# Patient Record
Sex: Male | Born: 1995 | Race: Black or African American | Hispanic: No | Marital: Single | Smoking: Never smoker
Health system: Southern US, Community
[De-identification: ages and names within clinical notes are randomized; demographics above are authoritative.]

## PROBLEM LIST (undated history)

## (undated) HISTORY — PX: KNEE SURGERY: SHX244

---
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
## 2013-09-17 ENCOUNTER — Emergency Department: Payer: Self-pay | Admitting: Emergency Medicine

## 2013-09-19 LAB — BETA STREP CULTURE(ARMC)

## 2014-02-24 ENCOUNTER — Emergency Department: Payer: Self-pay | Admitting: Emergency Medicine

## 2015-03-14 ENCOUNTER — Emergency Department: Payer: Self-pay

## 2015-03-14 ENCOUNTER — Encounter: Payer: Self-pay | Admitting: Emergency Medicine

## 2015-03-14 DIAGNOSIS — S8992XA Unspecified injury of left lower leg, initial encounter: Secondary | ICD-10-CM | POA: Insufficient documentation

## 2015-03-14 DIAGNOSIS — Y998 Other external cause status: Secondary | ICD-10-CM | POA: Insufficient documentation

## 2015-03-14 DIAGNOSIS — Y92310 Basketball court as the place of occurrence of the external cause: Secondary | ICD-10-CM | POA: Insufficient documentation

## 2015-03-14 DIAGNOSIS — Y9367 Activity, basketball: Secondary | ICD-10-CM | POA: Insufficient documentation

## 2015-03-14 DIAGNOSIS — X58XXXA Exposure to other specified factors, initial encounter: Secondary | ICD-10-CM | POA: Insufficient documentation

## 2015-03-14 NOTE — ED Notes (Signed)
Pt presents to ED with left knee pain. Pt states onset of pain occurred while playing basket ball. Pt reports he is unsure of injury but thinks maybe he "came down on it wrong". Slight swelling; no obvious deformity. Pain increases when walking and when lying down. similar symptoms previously and was dx with bone bruise at that time.

## 2015-03-15 ENCOUNTER — Emergency Department
Admission: EM | Admit: 2015-03-15 | Discharge: 2015-03-15 | Disposition: A | Discharge status: Home / Self Care | Payer: Self-pay | Attending: Emergency Medicine | Admitting: Emergency Medicine

## 2015-03-15 DIAGNOSIS — M25562 Pain in left knee: Secondary | ICD-10-CM

## 2015-03-15 MED ORDER — TRAMADOL HCL 50 MG PO TABS
100.0000 mg | ORAL_TABLET | Freq: Once | ORAL | Status: AC
  Administered 2015-03-15: 100 mg via ORAL
  Filled 2015-03-15: qty 2

## 2015-03-15 MED ORDER — TRAMADOL HCL 50 MG PO TABS: 100.0000 mg | ORAL_TABLET | Freq: Four times a day (QID) | ORAL | Status: DC | PRN

## 2015-03-15 NOTE — ED Provider Notes (Signed)
[facility] Emergency Department Provider Note  Time seen: 12:36 AM  I have reviewed the triage vital signs and the nursing notes.   HISTORY  Chief Complaint Knee Pain    HPI Seth Frank is a 19 y.o. male with no past medical history who presents the emergency department with left knee pain. According to the patient one week ago he was playing basketball when he states he jumped and landed on the knee with immediate pain to the left knee, with swelling. States since that time his knee has continued to bother him although states the swelling has come down, and is not as painful as it was. He is able to walk on it without limping but states is still uncomfortable. Denies any clicking or buckling of the knee.     History reviewed. No pertinent past medical history.  There are no active problems to display for this patient.   History reviewed. No pertinent past surgical history.  No current outpatient prescriptions on file.  Allergies Review of patient's allergies indicates no known allergies.  No family history on file.  Social History Social History  Substance Use Topics  . Smoking status: Never Smoker   . Smokeless tobacco: Never Used  . Alcohol Use: No    Review of Systems Constitutional: Negative for fever. Cardiovascular: Negative for chest pain. Respiratory: Negative for shortness of breath. Gastrointestinal: Negative for abdominal pain Musculoskeletal: Positive left knee pain Neurological: Negative for headache 10-point ROS otherwise negative.  ____________________________________________   PHYSICAL EXAM:  VITAL SIGNS: ED Triage Vitals  Enc Vitals Group     BP 03/14/15 2034 136/82 mmHg     Pulse Rate 03/14/15 2034 77     Resp 03/14/15 2034 18     Temp 03/14/15 2034 98.1 F (36.7 C)     Temp Source 03/14/15 2034 Oral     SpO2 03/14/15 2034 99 %     Weight 03/14/15 2034 185 lb (83.915 kg)     Height 03/14/15 2034   (1.702 m)     Head Cir --      Peak Flow --      Pain Score 03/14/15 2034 9     Pain Loc --      Pain Edu? --      Excl. in GC? --     Constitutional: Alert and oriented. Well appearing and in no distress. Eyes: Normal exam ENT   Head: Normocephalic and atraumatic   Mouth/Throat: Mucous membranes are moist. Cardiovascular: Normal rate, regular rhythm. No murmur Respiratory: Normal respiratory effort without tachypnea nor retractions. Breath sounds are clear Gastrointestinal: Soft and nontender. No distention.  Musculoskeletal: No tenderness to palpation of the left knee, possible small joint effusion, anterior drawer test negative, no clicking elicited on exam, no laxity noted on exam, neurovascularly intact Neurologic:  Normal speech and language. No gross focal neurologic deficits., Skin:  Skin is warm, dry and intact.  Psychiatric: Mood and affect are normal.   ____________________________________________      RADIOLOGY  X-ray negative for fracture, positive for effusion  INITIAL IMPRESSION / ASSESSMENT AND PLAN / ED COURSE  Pertinent labs & imaging results that were available during my care of the patient were reviewed by me and considered in my medical decision making (see chart for details).  X-ray shows a small effusion, no fracture. No laxity on knee exam. Discussed with the patient trial of pain medication, and follow-up with orthopedics for an MRI if symptoms do  not improve over the next 3-5 days. Patient agreeable.  ____________________________________________   FINAL CLINICAL IMPRESSION(S) / ED DIAGNOSES  Left knee pain   Minna AntisKevin Kristol Almanzar, MD 03/15/15 0040

## 2015-03-15 NOTE — Discharge Instructions (Signed)
As we have discussed your x-ray does not show any acute abnormality. Your pain could likely be due to to a meniscus/cartilage tear in the knee, ligament, tendon or muscle tear. Please take your pain medication as needed, as prescribed. Please follow-up with orthopedics by calling the number provided if her pain continues to arrange further evaluation.   Joint Pain Joint pain, which is also called arthralgia, can be caused by many things. Joint pain often goes away when you follow your health care provider's instructions for relieving pain at home. However, joint pain can also be caused by conditions that require further treatment. Common causes of joint pain include:  Bruising in the area of the joint.  Overuse of the joint.  Wear and tear on the joints that occur with aging (osteoarthritis).  Various other forms of arthritis.  A buildup of a crystal form of uric acid in the joint (gout).  Infections of the joint (septic arthritis) or of the bone (osteomyelitis). Your health care provider may recommend medicine to help with the pain. If your joint pain continues, additional tests may be needed to diagnose your condition. HOME CARE INSTRUCTIONS Watch your condition for any changes. Follow these instructions as directed to lessen the pain that you are feeling.  Take medicines only as directed by your health care provider.  Rest the affected area for as long as your health care provider says that you should. If directed to do so, raise the painful joint above the level of your heart while you are sitting or lying down.  Do not do things that cause or worsen pain.  If directed, apply ice to the painful area:  Put ice in a plastic bag.  Place a towel between your skin and the bag.  Leave the ice on for 20 minutes, 2-3 times per day.  Wear an elastic bandage, splint, or sling as directed by your health care provider. Loosen the elastic bandage or splint if your fingers or toes become  numb and tingle, or if they turn cold and blue.  Begin exercising or stretching the affected area as directed by your health care provider. Ask your health care provider what types of exercise are safe for you.  Keep all follow-up visits as directed by your health care provider. This is important. SEEK MEDICAL CARE IF:  Your pain increases, and medicine does not help.  Your joint pain does not improve within 3 days.  You have increased bruising or swelling.  You have a fever.  You lose 10 lb (4.5 kg) or more without trying. SEEK IMMEDIATE MEDICAL CARE IF:  You are not able to move the joint.  Your fingers or toes become numb or they turn cold and blue.   This information is not intended to replace advice given to you by your health care provider. Make sure you discuss any questions you have with your health care provider.   Document Released: 03/06/2005 Document Revised: 03/27/2014 Document Reviewed: 12/16/2013 Elsevier Interactive Patient Education 2016 Else[employer] Inc.  Knee Pain Knee pain is a very common symptom and can have many causes. Knee pain often goes away when you follow your health care provider's instructions for relieving pain and discomfort at home. However, knee pain can develop into a condition that needs treatment. Some conditions may include:  Arthritis caused by wear and tear (osteoarthritis).  Arthritis caused by swelling and irritation (rheumatoid arthritis or gout).  A cyst or growth in your knee.  An infection in your knee  joint.  An injury that will not heal.  Damage, swelling, or irritation of the tissues that support your knee (torn ligaments or tendinitis). If your knee pain continues, additional tests may be ordered to diagnose your condition. Tests may include X-rays or other imaging studies of your knee. You may also need to have fluid removed from your knee. Treatment for ongoing knee pain depends on the cause, but treatment may  include:  Medicines to relieve pain or swelling.  Steroid injections in your knee.  Physical therapy.  Surgery. HOME CARE INSTRUCTIONS  Take medicines only as directed by your health care provider.  Rest your knee and keep it raised (elevated) while you are resting.  Do not do things that cause or worsen pain.  Avoid high-impact activities or exercises, such as running, jumping rope, or doing jumping jacks.  Apply ice to the knee area:  Put ice in a plastic bag.  Place a towel between your skin and the bag.  Leave the ice on for 20 minutes, 2-3 times a day.  Ask your health care provider if you should wear an elastic knee support.  Keep a pillow under your knee when you sleep.  Lose weight if you are overweight. Extra weight can put pressure on your knee.  Do not use any tobacco products, including cigarettes, chewing tobacco, or electronic cigarettes. If you need help quitting, ask your health care provider. Smoking may slow the healing of any bone and joint problems that you may have. SEEK MEDICAL CARE IF:  Your knee pain continues, changes, or gets worse.  You have a fever along with knee pain.  Your knee buckles or locks up.  Your knee becomes more swollen. SEEK IMMEDIATE MEDICAL CARE IF:   Your knee joint feels hot to the touch.  You have chest pain or trouble breathing.   This information is not intended to replace advice given to you by your health care provider. Make sure you discuss any questions you have with your health care provider.   Document Released: 01/01/2007 Document Revised: 03/27/2014 Document Reviewed: 10/20/2013 Elsevier Interactive Patient Education [employer] Elsevier Inc.

## 2015-03-15 NOTE — ED Notes (Signed)
Pt sitting uprite on side of stretcher with no distress noted; pt c/o left knee pain since playing basketball week ago; denies any specific injury however, only pain after game; +PP, brisk cap refill, W&D, good movem/sensation noted with slight swelling to knee area

## 2015-11-16 ENCOUNTER — Emergency Department (HOSPITAL_COMMUNITY)
Admission: EM | Admit: 2015-11-16 | Discharge: 2015-11-16 | Disposition: A | Discharge status: Home / Self Care | Payer: No Typology Code available for payment source | Attending: Emergency Medicine | Admitting: Emergency Medicine

## 2015-11-16 ENCOUNTER — Encounter (HOSPITAL_COMMUNITY): Payer: Self-pay | Admitting: Emergency Medicine

## 2015-11-16 ENCOUNTER — Emergency Department (HOSPITAL_COMMUNITY): Payer: No Typology Code available for payment source

## 2015-11-16 DIAGNOSIS — Z23 Encounter for immunization: Secondary | ICD-10-CM | POA: Insufficient documentation

## 2015-11-16 DIAGNOSIS — S81011A Laceration without foreign body, right knee, initial encounter: Secondary | ICD-10-CM | POA: Diagnosis not present

## 2015-11-16 DIAGNOSIS — Y92410 Unspecified street and highway as the place of occurrence of the external cause: Secondary | ICD-10-CM | POA: Diagnosis not present

## 2015-11-16 DIAGNOSIS — Y939 Activity, unspecified: Secondary | ICD-10-CM | POA: Diagnosis not present

## 2015-11-16 DIAGNOSIS — IMO0002 Reserved for concepts with insufficient information to code with codable children: Secondary | ICD-10-CM

## 2015-11-16 DIAGNOSIS — Y999 Unspecified external cause status: Secondary | ICD-10-CM | POA: Insufficient documentation

## 2015-11-16 MED ORDER — TETANUS-DIPHTH-ACELL PERTUSSIS 5-2.5-18.5 LF-MCG/0.5 IM SUSP
0.5000 mL | Freq: Once | INTRAMUSCULAR | Status: AC
  Administered 2015-11-16: 0.5 mL via INTRAMUSCULAR
  Filled 2015-11-16: qty 0.5

## 2015-11-16 MED ORDER — IBUPROFEN 800 MG PO TABS: 800.0000 mg | ORAL_TABLET | Freq: Three times a day (TID) | ORAL | 0 refills | Status: DC

## 2015-11-16 MED ORDER — NAPROXEN 375 MG PO TABS
375.0000 mg | ORAL_TABLET | Freq: Once | ORAL | Status: DC
  Filled 2015-11-16 (×2): qty 1

## 2015-11-16 MED ORDER — LIDOCAINE-EPINEPHRINE-TETRACAINE (LET) SOLUTION
3.0000 mL | Freq: Once | NASAL | Status: AC
  Administered 2015-11-16: 3 mL via TOPICAL
  Filled 2015-11-16: qty 3
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
## 2015-11-26 ENCOUNTER — Encounter: Payer: Self-pay | Admitting: Emergency Medicine

## 2015-11-26 ENCOUNTER — Emergency Department
Admission: EM | Admit: 2015-11-26 | Discharge: 2015-11-26 | Disposition: A | Discharge status: Home / Self Care | Payer: No Typology Code available for payment source | Attending: Emergency Medicine | Admitting: Emergency Medicine

## 2015-11-26 DIAGNOSIS — Z4802 Encounter for removal of sutures: Secondary | ICD-10-CM

## 2015-11-26 NOTE — ED Triage Notes (Signed)
Here for staple rmoval

## 2015-11-26 NOTE — Discharge Instructions (Signed)
Tylenol or ibuprofen as needed for discomfort. Keep area clean dry. Continue to clean daily with mild soap and water. [facility] if any continued problems.

## 2015-11-26 NOTE — ED Provider Notes (Signed)
[facility] Emergency Department Provider Note   ____________________________________________   First MD Initiated Contact with Patient 11/26/15 1126     (approximate)  I have reviewed the triage vital signs and the nursing notes.   HISTORY  Chief Complaint Suture / Staple Removal   HPI Seth Frank is a 20 y.o. male is here for staple removal.[facility] at which time the laceration to his right knee was stapled. Patient denies any difficulty with the area and has seen no drainage. He denies any fever or chills.   History reviewed. No pertinent past medical history.  There are no active problems to display for this patient.   Past Surgical History:  Procedure Laterality Date  . KNEE SURGERY Right     Prior to Admission medications   Medication Sig Start Date End Date Taking? Authorizing Provider  ibuprofen (ADVIL,MOTRIN) 800 MG tablet Take 1 tablet (800 mg total) by mouth 3 (three) times daily. 11/16/15   April Palumbo, MD    Allergies Review of patient's allergies indicates no known allergies.  No family history on file.  Social History Social History  Substance Use Topics  . Smoking status: Never Smoker  . Smokeless tobacco: Never Used  . Alcohol use No    Review of Systems Constitutional: No fever/chills Cardiovascular: Denies chest pain. Respiratory: Denies shortness of breath. Musculoskeletal: Resolving right knee pain. Skin: Positive for laceration. Neurological: Negative for headaches  10-point ROS otherwise negative.  ____________________________________________   PHYSICAL EXAM:  VITAL SIGNS: ED Triage Vitals  Enc Vitals Group     BP      Pulse      Resp      Temp      Temp src      SpO2      Weight      Height      Head Circumference      Peak Flow      Pain Score      Pain Loc      Pain Edu?      Excl. in GC?     Constitutional: Alert and oriented. Well appearing  and in no acute distress. Eyes: Conjunctivae are normal. PERRL. EOMI. Head: Atraumatic. Nose: No congestion/rhinnorhea. Neck: No stridor.   Cardiovascular: Normal rate, regular rhythm. Grossly normal heart sounds.  Good peripheral circulation. Respiratory: Normal respiratory effort.  No retractions. Lungs CTAB. Musculoskeletal: Moves right knee without difficulty or assistance. Neurologic:  Normal speech and language. No gross focal neurologic deficits are appreciated. No gait instability. Skin:  Skin is warm, dry. Staples are intact. Skin appears to be well-healed without evidence of infection. Psychiatric: Mood and affect are normal. Speech and behavior are normal.  ____________________________________________   LABS (all labs ordered are listed, but only abnormal results are displayed)  Labs Reviewed - No data to display   PROCEDURES  Procedure(s) performed: None  Procedures  Critical Care performed: No  ____________________________________________   INITIAL IMPRESSION / ASSESSMENT AND PLAN / ED COURSE  Pertinent labs & imaging results that were available during my care of the patient were reviewed by me and considered in my medical decision making (see chart for details).    Clinical Course   Staples were removed and patient was told to continue keeping area clean and dry. He is to continue cleaning with mild soap and water daily. He is follow-up with his primary care doctor or [facility] if any continued problems.  ____________________________________________   FINAL CLINICAL IMPRESSION(S) / ED DIAGNOSES  Final diagnoses:  Encounter for staple removal      NEW MEDICATIONS STARTED DURING THIS VISIT:  Discharge Medication List as of 11/26/2015 11:37 AM       Note:  This document was prepared using Dragon voice recognition software and may include unintentional dictation errors.    Tommi Rumps, PA-C 11/26/15 1236    Tommi Rumps,  PA-C 11/26/15 1236    Jene Every, MD 11/26/15 [phone]
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
## 2017-07-02 ENCOUNTER — Emergency Department
Admission: EM | Admit: 2017-07-02 | Discharge: 2017-07-02 | Disposition: A | Discharge status: Home / Self Care | Payer: 59 | Attending: Emergency Medicine | Admitting: Emergency Medicine

## 2017-07-02 ENCOUNTER — Emergency Department: Payer: 59

## 2017-07-02 ENCOUNTER — Encounter: Payer: Self-pay | Admitting: Emergency Medicine

## 2017-07-02 DIAGNOSIS — Y929 Unspecified place or not applicable: Secondary | ICD-10-CM | POA: Diagnosis not present

## 2017-07-02 DIAGNOSIS — S6991XA Unspecified injury of right wrist, hand and finger(s), initial encounter: Secondary | ICD-10-CM | POA: Diagnosis present

## 2017-07-02 DIAGNOSIS — S60051A Contusion of right little finger without damage to nail, initial encounter: Secondary | ICD-10-CM | POA: Diagnosis not present

## 2017-07-02 DIAGNOSIS — Y999 Unspecified external cause status: Secondary | ICD-10-CM | POA: Diagnosis not present

## 2017-07-02 DIAGNOSIS — Y9389 Activity, other specified: Secondary | ICD-10-CM | POA: Insufficient documentation

## 2017-07-02 DIAGNOSIS — X58XXXA Exposure to other specified factors, initial encounter: Secondary | ICD-10-CM | POA: Diagnosis not present

## 2017-07-02 MED ORDER — MELOXICAM 15 MG PO TABS: 15.0000 mg | ORAL_TABLET | Freq: Every day | ORAL | 1 refills | Status: AC

## 2017-07-02 NOTE — ED Triage Notes (Signed)
Pt reports he was playing basketball this evening when the ball hit his right little finger. Pt sts he felt it "snap." swelling noted to area.

## 2017-07-02 NOTE — ED Provider Notes (Signed)
[facility] Emergency Department Provider Note  ____________________________________________  Time seen: Approximately 11:27 PM  I have reviewed the triage vital signs and the nursing notes.   HISTORY  Chief Complaint Finger Injury    HPI Seth Frank is a 22 y.o. male presents to the emergency department with right fifth digit pain.  Patient reports mild edema over the base of the right fifth digit.  Patient denies prior right hand traumas.  He currently rates his pain at 8 out of 10 in intensity and describes it as sore.  No medications were attempted prior to presenting to the emergency department.   History reviewed. No pertinent past medical history.  There are no active problems to display for this patient.   Past Surgical History:  Procedure Laterality Date  . KNEE SURGERY Right     Prior to Admission medications   Medication Sig Start Date End Date Taking? Authorizing Provider  ibuprofen (ADVIL,MOTRIN) 800 MG tablet Take 1 tablet (800 mg total) by mouth 3 (three) times daily. 11/16/15   Palumbo, April, MD  meloxicam (MOBIC) 15 MG tablet Take 1 tablet (15 mg total) by mouth daily for 7 days. 07/02/17 07/09/17  Orvil Feil, PA-C    Allergies Patient has no known allergies.  History reviewed. No pertinent family history.  Social History Social History   Tobacco Use  . Smoking status: Never Smoker  . Smokeless tobacco: Never Used  Substance Use Topics  . Alcohol use: No  . Drug use: No     Review of Systems  Constitutional: No fever/chills Eyes: No visual changes. No discharge ENT: No upper respiratory complaints. Cardiovascular: no chest pain. Respiratory: no cough. No SOB. Gastrointestinal: No abdominal pain.  No nausea, no vomiting.  No diarrhea.  No constipation. Musculoskeletal: Patient has right fifth digit pain. Skin: Negative for rash, abrasions, lacerations, ecchymosis. Neurological: Negative for headaches, focal  weakness or numbness.   ____________________________________________   PHYSICAL EXAM:  VITAL SIGNS: ED Triage Vitals [07/02/17 1940]  Enc Vitals Group     BP 134/79     Pulse Rate 81     Resp 16     Temp 97.7 F (36.5 C)     Temp Source Oral     SpO2 97 %     Weight 185 lb (83.9 kg)     Height      Head Circumference      Peak Flow      Pain Score 8     Pain Loc      Pain Edu?      Excl. in GC?      Constitutional: Alert and oriented. Well appearing and in no acute distress. Eyes: Conjunctivae are normal. PERRL. EOMI. Head: Atraumatic. Cardiovascular: Normal rate, regular rhythm. Normal S1 and S2.  Good peripheral circulation. Respiratory: Normal respiratory effort without tachypnea or retractions. Lungs CTAB. Good air entry to the bases with no decreased or absent breath sounds. Musculoskeletal: No deficits appreciated with flexor and extensor tendon testing, right fifth digit.  Palpable radial pulse, right. Neurologic:  Normal speech and language. No gross focal neurologic deficits are appreciated.  Skin: Mild edema at the right fifth digit visualized. Psychiatric: Mood and affect are normal. Speech and behavior are normal. Patient exhibits appropriate insight and judgement.   ____________________________________________   LABS (all labs ordered are listed, but only abnormal results are displayed)  Labs Reviewed - No data to display ____________________________________________  EKG   ____________________________________________  RADIOLOGY I,  Orvil FeilJaclyn M Viriginia Amendola, personally viewed and evaluated these images (plain radiographs) as part of my medical decision making, as well as reviewing the written report by the radiologist.  Dg Finger Little Right  Result Date: 07/02/2017 CLINICAL DATA:  Right little finger pain following a basketball injury this evening. EXAM: RIGHT LITTLE FINGER 2+V COMPARISON:  None. FINDINGS: Mild proximal soft tissue swelling. No fracture or  dislocation seen. IMPRESSION: No fracture. Electronically Signed   By: Beckie SaltsSteven  Reid M.D.   On: 07/02/2017 20:32    ____________________________________________    PROCEDURES  Procedure(s) performed:    Procedures    Medications - No data to display   ____________________________________________   INITIAL IMPRESSION / ASSESSMENT AND PLAN / ED COURSE  Pertinent labs & imaging results that were available during my care of the patient were reviewed by me and considered in my medical decision making (see chart for details).  Review of the [place] CSRS was performed in accordance of the [facility] prior to dispensing any controlled drugs.     Assessment and plan Right fifth digit pain Patient presents to the emergency department with right fifth digit pain after playing basketball.  Differential diagnosis included fracture versus contusion.  No acute bony abnormalities were identified on x-ray examination of the right hand.  Patient was splinted in the emergency department and advised to follow-up with  orthopedics.  Patient was discharged with meloxicam.  All patient questions were answered.   ____________________________________________  FINAL CLINICAL IMPRESSION(S) / ED DIAGNOSES  Final diagnoses:  Contusion of right little finger without damage to nail, initial encounter      NEW MEDICATIONS STARTED DURING THIS VISIT:  ED Discharge Orders        Ordered    meloxicam (MOBIC) 15 MG tablet  Daily     07/02/17 2127          This chart was dictated using voice recognition software/Dragon. Despite best efforts to proofread, errors can occur which can change the meaning. Any change was purely unintentional.    Orvil FeilWoods, Jeneane Pieczynski M, PA-C 07/02/17 2331    Myrna BlazerSchaevitz, David Matthew, MD 07/03/17 [id number]

## 2018-11-06 ENCOUNTER — Telehealth: Payer: Self-pay | Admitting: General Practice

## 2018-11-06 ENCOUNTER — Emergency Department
Admission: EM | Admit: 2018-11-06 | Discharge: 2018-11-06 | Disposition: A | Discharge status: Home / Self Care | Payer: 59 | Attending: Emergency Medicine | Admitting: Emergency Medicine

## 2018-11-06 ENCOUNTER — Encounter: Payer: Self-pay | Admitting: Emergency Medicine

## 2018-11-06 ENCOUNTER — Other Ambulatory Visit: Payer: Self-pay

## 2018-11-06 ENCOUNTER — Ambulatory Visit: Payer: Self-pay | Admitting: Nurse Practitioner

## 2018-11-06 DIAGNOSIS — A549 Gonococcal infection, unspecified: Secondary | ICD-10-CM

## 2018-11-06 DIAGNOSIS — A749 Chlamydial infection, unspecified: Secondary | ICD-10-CM

## 2018-11-06 DIAGNOSIS — Z113 Encounter for screening for infections with a predominantly sexual mode of transmission: Secondary | ICD-10-CM | POA: Insufficient documentation

## 2018-11-06 DIAGNOSIS — Z7689 Persons encountering health services in other specified circumstances: Secondary | ICD-10-CM

## 2018-11-06 MED ORDER — CEFTRIAXONE SODIUM 250 MG IJ SOLR
250.0000 mg | Freq: Once | INTRAMUSCULAR | Status: AC
  Administered 2018-11-06: 16:00:00 250 mg via INTRAMUSCULAR

## 2018-11-06 MED ORDER — AZITHROMYCIN 500 MG PO TABS
1000.0000 mg | ORAL_TABLET | Freq: Once | ORAL | Status: AC
  Administered 2018-11-06: 16:00:00 1000 mg via ORAL

## 2018-11-06 NOTE — ED Triage Notes (Signed)
Pt here with c/o burning with urination for about 2 days now, denies fever, white discharge as well. NAD.

## 2018-11-06 NOTE — ED Notes (Signed)
See triage note  Presents with some burning with urination  Also has some penile discharge

## 2018-11-06 NOTE — Progress Notes (Signed)
Patient here today stating he is a contact to [facility] and Chlamydia. Had [facility] ED visit this morning and per ED notes opted to follow up with the health department for treatment due to work hours. Declines testing and screening today. Hal Morales, RN  Patient treated for above per standing orders. Hal Morales, RN

## 2018-11-06 NOTE — Progress Notes (Signed)
STI clinic/screening visit  Subjective:  Seth Frank is a 23 y.o. male being seen today for an STI screening visit. The patient reports they do not have symptoms.  Patient has the following medical conditions:  There are no active problems to display for this patient.    Chief Complaint  Patient presents with  . Exposure to STD    Contact to Chlamydia and GC    Client into clinic stating he is a contact to GC/Chlamydia and desires treatment only - declines to answer all STD questions or receiving additional testing.   Patient reports - contact of GC and Chlamydia  Client declines to answer question on STD flowsheet.    The following portions of the patient's history were reviewed and updated as appropriate: allergies, current medications, past medical history, past social history, past surgical history and problem list.  Objective:  There were no vitals filed for this visit.  Physical Exam Vitals signs reviewed.  Constitutional:      Appearance: Normal appearance. He is well-developed and normal weight.  Pulmonary:     Effort: Pulmonary effort is normal.  Skin:    General: Skin is warm and dry.  Neurological:     Mental Status: He is alert.  Psychiatric:        Behavior: Behavior is cooperative.       Assessment and Plan:  Seth Frank is a 23 y.o. male presenting to the [facility] Department for STI screening  1. Gonorrhea and Chlamydia  Please treat client for above per standing order  - azithromycin (ZITHROMAX) tablet 1,000 mg - cefTRIAXone (ROCEPHIN) injection 250 mg   Client verbalizes understanding and is in agreement with plan of care  Return for PRN.  No future appointments.  Berniece Andreas, NP

## 2018-11-06 NOTE — ED Provider Notes (Signed)
[facility] Emergency Department Provider Note   ____________________________________________   First MD Initiated Contact with Patient 11/06/18 [phone]     (approximate)  I have reviewed the triage vital signs and the nursing notes.   HISTORY  Chief Complaint Burning with urination    HPI Seth Frank is a 22 y.o. male patient presents with 2 days of burning with urination.  Patient also state has a white discharge.  Patient stated last sexual contact was a few weeks ago.  Patient stated he was diagnosed with STD along with sexual partner and treated a few weeks ago.  Patient rates his pain is 8/10.  Patient describes his pain as "burning".  No palliative measure for complaint.         History reviewed. No pertinent past medical history.  There are no active problems to display for this patient.   Past Surgical History:  Procedure Laterality Date  . KNEE SURGERY Right     Prior to Admission medications   Not on File    Allergies Patient has no known allergies.  No family history on file.  Social History Social History   Tobacco Use  . Smoking status: Never Smoker  . Smokeless tobacco: Never Used  Substance Use Topics  . Alcohol use: No  . Drug use: No    Review of Systems Constitutional: No fever/chills Eyes: No visual changes. ENT: No sore throat. Cardiovascular: Denies chest pain. Respiratory: Denies shortness of breath. Gastrointestinal: No abdominal pain.  No nausea, no vomiting.  No diarrhea.  No constipation. Genitourinary: Positive for dysuria and urethral drainage. Musculoskeletal: Negative for back pain. Skin: Negative for rash. Neurological: Negative for headaches, focal weakness or numbness.   ____________________________________________   PHYSICAL EXAM:  VITAL SIGNS: ED Triage Vitals  Enc Vitals Group     BP 11/06/18 0845 (!) 138/100     Pulse Rate 11/06/18 0845 71     Resp 11/06/18 0845 16     Temp  11/06/18 0845 98.7 F (37.1 C)     Temp Source 11/06/18 0845 Oral     SpO2 11/06/18 0845 99 %     Weight 11/06/18 0838 205 lb (93 kg)     Height 11/06/18 0838 5\' 7"  (1.702 m)     Head Circumference --      Peak Flow --      Pain Score 11/06/18 0838 8     Pain Loc --      Pain Edu? --      Excl. in [place]? --    Constitutional: Alert and oriented. Well appearing and in no acute distress. Cardiovascular: Normal rate, regular rhythm. Grossly normal heart sounds.  Good peripheral circulation. Respiratory: Normal respiratory effort.  No retractions. Lungs CTAB. Genitourinary: Deferred  ____________________________________________   LABS (all labs ordered are listed, but only abnormal results are displayed)  Labs Reviewed - No data to display ____________________________________________  EKG   ____________________________________________  RADIOLOGY  ED MD interpretation:    Official radiology report(s): No results found.  ____________________________________________   PROCEDURES  Procedure(s) performed (including Critical Care):  Procedures   ____________________________________________   INITIAL IMPRESSION / ASSESSMENT AND PLAN / ED COURSE  As part of my medical decision making, I reviewed the following data within the [place] was evaluated in Emergency Department on 11/06/2018 for the symptoms described in the history of present illness. He was evaluated in the  context of the global COVID-19 pandemic, which necessitated consideration that the patient might be at risk for infection with the SARS-CoV-2 virus that causes COVID-19. Institutional protocols and algorithms that pertain to the evaluation of patients at risk for COVID-19 are in a state of rapid change based on information released by regulatory bodies including the CDC and federal and state organizations. These policies and algorithms were followed during the  patient's care in the ED.  Patient presents with 2 days of dysuria and urethral discharge.  Patient elected to be treated at [facility] after being advised of the wait time for lab results at this facility.      ____________________________________________   FINAL CLINICAL IMPRESSION(S) / ED DIAGNOSES  Final diagnoses:  Encounter for assessment of STD exposure     ED Discharge Orders    None       Note:  This document was prepared using Dragon voice recognition software and may include unintentional dictation errors.    Joni ReiningSmith, Antionio Negron K, PA-C 11/06/18 [id number]    Emily FilbertWilliams, Jonathan E, MD 11/06/18 1029

## 2018-11-06 NOTE — ED Notes (Signed)
Was tested for STD last month and negative. Has not had sex since per pt.

## 2018-11-08 ENCOUNTER — Encounter: Payer: Self-pay | Admitting: Nurse Practitioner

## 2018-11-08 NOTE — Patient Instructions (Signed)
Chlamydia, Male    Chlamydia is an STD (sexually transmitted disease). It is a bacterial infection that spreads through sexual contact (is contagious). Chlamydia can occur in different areas of the body, including the tube that moves urine from the bladder out of the body (urethra), the throat, or the rectum. This condition is not difficult to treat. However, if left untreated, chlamydia can lead to more serious health problems.  What are the causes?  Chlamydia is caused by the bacteria Chlamydia trachomatis. It is passed from an infected partner during sexual activity. Chlamydia can spread through contact with the genitals, mouth, or rectum.  What are the signs or symptoms?  In some cases, there may not be any symptoms for this condition (asymptomatic), especially early in the infection. If symptoms develop, they may include:  · Burning when urinating.  · Urinating frequently.  · Pain or swelling in the testicles.  · Watery, mucus-like discharge from the penis.  · Redness, soreness, and swelling (inflammation) of the rectum.  · Bleeding or discharge from the rectum.  · Abdominal pain.  · Itching, burning, or redness in the eyes, or discharge from the eyes.  How is this diagnosed?  This condition may be diagnosed based on:  · Urine tests.  · Swab tests. Depending on your symptoms, your health care provider may use a cotton swab to collect discharge from your urethra or rectum to test for the bacteria.  How is this treated?  This condition is treated with antibiotic medicines.  Follow these instructions at home:  Medicines  · Take over-the-counter and prescription medicines only as told by your health care provider.  · Take your antibiotic medicine as told by your health care provider. Do not stop taking the antibiotic even if you start to feel better.  Sexual activity  · Tell sexual partners about your infection. This includes any oral, anal, or vaginal sex partners you have had within 60 days of when your symptoms  started. Sexual partners should also be treated, even if they have no signs of the disease.  · Do not have sex until you and your sexual partners have completed treatment and your health care provider says it is okay. If your health care provider prescribed you a single dose treatment, wait 7 days after taking the treatment before having sex.  General instructions  · It is your responsibility to get your test results. Ask your health care provider, or the department performing the test, when your results will be ready.  · Get plenty of rest.  · Eat a healthy, well-balanced diet.  · Drink enough fluids to keep your urine clear or pale yellow.  · Keep all follow-up visits as told by your health care provider. This is important. You may need to be tested for infection again 3 months after treatment.  How is this prevented?  The only sure way to prevent chlamydia is to avoid sexual intercourse. However, you can lower your risk by:  · Using latex condoms correctly every time you have sexual intercourse.  · Not having multiple sexual partners.  · Asking if your sexual partner has been tested for STIs and had negative results.  Contact a health care provider if:  · You develop new symptoms or your symptoms do not get better after completing treatment.  · You have a fever or chills.  · You have pain during sexual intercourse.  · You develop new joint pain or swelling near your joints.  · You   Chlamydia is an STD (sexually transmitted disease). It is a bacterial infection that spreads (is contagious) through sexual contact.  This condition is not difficult to treat, however, if left untreated, it can lead to more serious health problems.  In some cases, there may not  be any symptoms for this condition (asymptomatic).  This condition is treated with antibiotic medicines.  Using latex condoms correctly every time you have sexual intercourse can help prevent chlamydia. This information is not intended to replace advice given to you by your health care provider. Make sure you discuss any questions you have with your health care provider. Document Released: 03/06/2005 Document Revised: 03/21/2017 Document Reviewed: 02/21/2016 Elsevier Patient Education  2020 [place] is a sexually transmitted disease (STD) that can affect both men and women. If left untreated, this infection can:  Damage the male or male organs.  Cause women and men to be unable to have children (be sterile).  Harm a fetus if an infected woman is pregnant. It is important to get treatment for gonorrhea as soon as possible. It is also necessary for all of your sexual partners to be tested for the infection. What are the causes? This condition is caused by bacteria called Neisseria gonorrhoeae. The infection is spread from person to person through sexual contact, including oral, anal, and vaginal sex. A newborn can contract the infection from his or her mother during birth. What increases the risk? The following factors may make you more likely to develop this condition:  Being a woman who is younger than 25 years of age and who is sexually active.  Being a woman 39 years of age or older who has: ? A new sex partner. ? More than one sex partner. ? A sex partner who has an STD.  Being a man who has: ? A new sex partner. ? More than one sex partner. ? A sex partner who has an STD.  Using condoms inconsistently.  Currently having, or having previously had, an STD.  Exchanging sex for money or drugs. What are the signs or symptoms? Some people do not have any symptoms. If you do have symptoms, they may be different for females and males. For  females  Pain in the lower abdomen.  Abnormal vaginal discharge. The discharge may be cloudy, thick, or yellow-green in color.  Bleeding between periods.  Painful sex.  Burning or itching in and around the vagina.  Pain or burning when urinating.  Irritation, pain, bleeding, or discharge from the rectum. This may occur if the infection was spread by anal sex.  Sore throat or swollen lymph nodes in the neck. This may occur if the infection was spread by oral sex. For males  Abnormal discharge from the penis. This discharge may be cloudy, thick, or yellow-green in color.  Pain or burning during urination.  Pain or swelling in the testicles.  Irritation, pain, bleeding, or discharge from the rectum. This may occur if the infection was spread by anal sex.  Sore throat, fever, or swollen lymph nodes in the neck. This may occur if the infection was spread by oral sex. How is this diagnosed? This condition is diagnosed based on:  A physical exam.  A sample of discharge that is examined under a microscope to look for the bacteria. The discharge may be taken from the urethra, cervix, throat, or rectum.  Urine tests. Not all of test results will be available during your visit. How  is this treated? This condition is treated with antibiotic medicines. It is important for treatment to begin as soon as possible. Early treatment may prevent some problems from developing. Do not have sex during treatment. Avoid all types of sexual activity for 7 days after treatment is complete and until any sex partners have been treated. Follow these instructions at home:  Take over-the-counter and prescription medicines only as told by your health care provider.  Take your antibiotic medicine as told by your health care provider. Do not stop taking the antibiotic even if you start to feel better.  Do not have sex until at least 7 days after you and your partner(s) have finished treatment and your  health care provider says it is okay.  It is your responsibility to get your test results. Ask your health care provider, or the department performing the test, when your results will be ready.  If you test positive for gonorrhea, inform your recent sexual partners. This includes any oral, anal, or vaginal sex partners. They need to be checked for gonorrhea even if they do not have symptoms. They may need treatment, even if they test negative for gonorrhea.  Keep all follow-up visits as told by your health care provider. This is important. How is this prevented?   Use latex condoms correctly every time you have sexual intercourse.  Ask if your sexual partner has been tested for STDs and had negative results.  Avoid having multiple sexual partners. Contact a health care provider if:  You develop a bad reaction to the medicine you were prescribed. This may include: ? A rash. ? Nausea. ? Vomiting. ? Diarrhea.  Your symptoms do not get better after a few days of taking antibiotics.  Your symptoms get worse.  You develop new symptoms.  Your pain gets worse.  You have a fever.  You develop pain, itching, or discharge around the eyes. Get help right away if:  You feel dizzy or faint.  You have trouble breathing or have shortness of breath.  You develop an irregular heartbeat.  You have severe abdominal pain with or without shoulder pain.  You develop any bumps or sores (lesions) on your skin.  You develop warmth, redness, pain, or swelling around your joints, such as the knee. Summary  Gonorrhea is an STD that can affect both men and women.  This condition is caused by bacteria called Neisseria gonorrhoeae. The infection is spread from person to person, usually through sexual contact, including oral, anal, and vaginal sex.  Symptoms vary between males and females. Generally, they include abnormal discharge and burning during urination. Women may also experience painful  sex, itching around the vagina, and bleeding between menstrual periods. Men may also experience swelling of the testicles.  This condition is treated with antibiotic medicines. Do not have sex until at least 7 days after completing antibiotic treatment.  If left untreated, gonorrhea can have serious side effects and complications. This information is not intended to replace advice given to you by your health care provider. Make sure you discuss any questions you have with your health care provider. Document Released: 03/03/2000 Document Revised: 04/12/2018 Document Reviewed: 02/04/2016 Elsevier Patient Education  2020 [employer]

## 2018-11-12 ENCOUNTER — Ambulatory Visit: Payer: Self-pay

## 2019-05-15 ENCOUNTER — Emergency Department
Admission: EM | Admit: 2019-05-15 | Discharge: 2019-05-15 | Disposition: A | Discharge status: Home / Self Care | Payer: BC Managed Care – PPO | Attending: Student | Admitting: Student

## 2019-05-15 ENCOUNTER — Emergency Department: Payer: BC Managed Care – PPO

## 2019-05-15 ENCOUNTER — Other Ambulatory Visit: Payer: Self-pay

## 2019-05-15 ENCOUNTER — Encounter: Payer: Self-pay | Admitting: Intensive Care

## 2019-05-15 DIAGNOSIS — Y999 Unspecified external cause status: Secondary | ICD-10-CM | POA: Diagnosis not present

## 2019-05-15 DIAGNOSIS — X58XXXA Exposure to other specified factors, initial encounter: Secondary | ICD-10-CM | POA: Insufficient documentation

## 2019-05-15 DIAGNOSIS — Y9367 Activity, basketball: Secondary | ICD-10-CM | POA: Diagnosis not present

## 2019-05-15 DIAGNOSIS — M25511 Pain in right shoulder: Secondary | ICD-10-CM | POA: Insufficient documentation

## 2019-05-15 MED ORDER — MELOXICAM 15 MG PO TABS: 15.0000 mg | ORAL_TABLET | Freq: Every day | ORAL | 1 refills | Status: AC

## 2019-05-15 NOTE — ED Notes (Signed)
Pt reporting right shoulder pain after playing basketball 2 days ago. Pt can raise his arm, but states he can feel it popping.

## 2019-05-15 NOTE — ED Provider Notes (Signed)
Emergency Department Provider Note  ____________________________________________  Time seen: Approximately 6:20 PM  I have reviewed the triage vital signs and the nursing notes.   HISTORY  Chief Complaint Shoulder Pain (right)   Historian Patient     HPI Seth Frank is a 24 y.o. male presents to the emergency department with acute right shoulder pain after playing basketball 2 days ago.  Patient reports that he routinely engages in heavy lifting and overhead activities.  He denies right upper extremity weakness or numbness and tingling in the upper extremities.  No falls.  No similar symptoms in the past.   History reviewed. No pertinent past medical history.   Immunizations up to date:  Yes.     History reviewed. No pertinent past medical history.  There are no problems to display for this patient.   Past Surgical History:  Procedure Laterality Date  . KNEE SURGERY Right     Prior to Admission medications   Medication Sig Start Date End Date Taking? Authorizing Provider  meloxicam (MOBIC) 15 MG tablet Take 1 tablet (15 mg total) by mouth daily for 7 days. 05/15/19 05/22/19  Orvil Feil, PA-C    Allergies Patient has no known allergies.  History reviewed. No pertinent family history.  Social History Social History   Tobacco Use  . Smoking status: Never Smoker  . Smokeless tobacco: Never Used  Substance Use Topics  . Alcohol use: No  . Drug use: No     Review of Systems  Constitutional: No fever/chills Eyes:  No discharge ENT: No upper respiratory complaints. Respiratory: no cough. No SOB/ use of accessory muscles to breath Gastrointestinal:   No nausea, no vomiting.  No diarrhea.  No constipation. Musculoskeletal: Patient has right shoulder pain.  Skin: Negative for rash, abrasions, lacerations, ecchymosis.    ____________________________________________   PHYSICAL EXAM:  VITAL SIGNS: ED Triage Vitals  Enc Vitals Group     BP  05/15/19 1635 129/88     Pulse Rate 05/15/19 1635 69     Resp 05/15/19 1635 14     Temp 05/15/19 1635 98.2 F (36.8 C)     Temp Source 05/15/19 1635 Oral     SpO2 05/15/19 1635 98 %     Weight 05/15/19 1633 205 lb (93 kg)     Height 05/15/19 1633 5\' 6"  (1.676 m)     Head Circumference --      Peak Flow --      Pain Score 05/15/19 1632 6     Pain Loc --      Pain Edu? --      Excl. in GC? --      Constitutional: Alert and oriented. Well appearing and in no acute distress. Eyes: Conjunctivae are normal. PERRL. EOMI. Head: Atraumatic. Cardiovascular: Normal rate, regular rhythm. Normal S1 and S2.  Good peripheral circulation. Respiratory: Normal respiratory effort without tachypnea or retractions. Lungs CTAB. Good air entry to the bases with no decreased or absent breath sounds Gastrointestinal: Bowel sounds x 4 quadrants. Soft and nontender to palpation. No guarding or rigidity. No distention. Musculoskeletal: Patient has 5 out of 5 strength in the upper extremities bilaterally and symmetrically.  He does have pain with right rotator cuff testing but no weakness.  Palpable radial pulse, right. Neurologic:  Normal for age. No gross focal neurologic deficits are appreciated.  Skin:  Skin is warm, dry and intact. No rash noted. Psychiatric: Mood and affect are normal for age. Speech and behavior are normal.  ____________________________________________   LABS (all labs ordered are listed, but only abnormal results are displayed)  Labs Reviewed - No data to display ____________________________________________  EKG   ____________________________________________  RADIOLOGY Unk Pinto, personally viewed and evaluated these images (plain radiographs) as part of my medical decision making, as well as reviewing the written report by the radiologist.  DG Shoulder Right  Result Date: 05/15/2019 CLINICAL DATA:  Right shoulder pain. EXAM: RIGHT SHOULDER - 2+ VIEW COMPARISON:   None. FINDINGS: There is no evidence of fracture or dislocation. There is no evidence of arthropathy or other focal bone abnormality. Soft tissues are unremarkable. IMPRESSION: Negative. Electronically Signed   By: Constance Holster M.D.   On: 05/15/2019 17:56    ____________________________________________    PROCEDURES  Procedure(s) performed:     Procedures     Medications - No data to display   ____________________________________________   INITIAL IMPRESSION / ASSESSMENT AND PLAN / ED COURSE  Pertinent labs & imaging results that were available during my care of the patient were reviewed by me and considered in my medical decision making (see chart for details).      Assessment and plan Rotator cuff tendinitis 24 year old male presents to the emergency department with acute right shoulder pain after playing basketball 2 days ago.  Vital signs are reassuring at triage.  On physical exam, patient has pain with right rotator cuff testing but no weakness.  Patient was discharged with meloxicam.  He was advised to follow-up with orthopedics if symptoms persist.  All patient questions were answered.   ____________________________________________  FINAL CLINICAL IMPRESSION(S) / ED DIAGNOSES  Final diagnoses:  Acute pain of right shoulder      NEW MEDICATIONS STARTED DURING THIS VISIT:  ED Discharge Orders         Ordered    meloxicam (MOBIC) 15 MG tablet  Daily     05/15/19 1815              This chart was dictated using voice recognition software/Dragon. Despite best efforts to proofread, errors can occur which can change the meaning. Any change was purely unintentional.     Lannie Fields, PA-C 05/15/19 Abel Presto., MD 05/15/19 2034

## 2019-05-15 NOTE — ED Triage Notes (Signed)
Experiencing right shoulder pain after playing basketball a couple of days ago

## 2019-07-09 NOTE — Telephone Encounter (Signed)
Encounter opened in error

## 2020-02-17 ENCOUNTER — Emergency Department
Admission: EM | Admit: 2020-02-17 | Discharge: 2020-02-17 | Disposition: A | Discharge status: Home / Self Care | Payer: 59 | Attending: Emergency Medicine | Admitting: Emergency Medicine

## 2020-02-17 ENCOUNTER — Other Ambulatory Visit: Payer: Self-pay

## 2020-02-17 DIAGNOSIS — L270 Generalized skin eruption due to drugs and medicaments taken internally: Secondary | ICD-10-CM

## 2020-02-17 DIAGNOSIS — R21 Rash and other nonspecific skin eruption: Secondary | ICD-10-CM | POA: Diagnosis present

## 2020-02-17 MED ORDER — DEXAMETHASONE SODIUM PHOSPHATE 10 MG/ML IJ SOLN
10.0000 mg | Freq: Once | INTRAMUSCULAR | Status: AC
  Administered 2020-02-17: 10 mg via INTRAMUSCULAR
  Filled 2020-02-17: qty 1

## 2020-02-17 MED ORDER — PREDNISONE 10 MG (21) PO TBPK: ORAL_TABLET | ORAL | 0 refills | Status: AC

## 2020-02-17 NOTE — ED Provider Notes (Signed)
[facility] Emergency Department Provider Note  ____________________________________________   First MD Initiated Contact with Patient 02/17/20 1127     (approximate)  I have reviewed the triage vital signs and the nursing notes.   HISTORY  Chief Complaint Rash    HPI Seth Frank is a 24 y.o. male presents with rash to the face for 1 week.  Had a wound on his arm secondary to a chemical from work.  Was treated with keflex, pt states he broke out last week but continued the medication until 2 days ago.  Denies fever, sore throat, or difficulty breathing    History reviewed. No pertinent past medical history.  There are no problems to display for this patient.   Past Surgical History:  Procedure Laterality Date  . KNEE SURGERY Right     Prior to Admission medications   Medication Sig Start Date End Date Taking? Authorizing Provider  predniSONE (STERAPRED UNI-PAK 21 TAB) 10 MG (21) TBPK tablet Take 6 pills on day one then decrease by 1 pill each day 02/17/20   Faythe Ghee, PA-C    Allergies Keflex [cephalexin]  History reviewed. No pertinent family history.  Social History Social History   Tobacco Use  . Smoking status: Never Smoker  . Smokeless tobacco: Never Used  Substance Use Topics  . Alcohol use: No  . Drug use: No    Review of Systems  Constitutional: No fever/chills Eyes: No visual changes. ENT: No sore throat. Respiratory: Denies cough Genitourinary: Negative for dysuria. Musculoskeletal: Negative for back pain. Skin: Positive for rash. Psychiatric: no mood changes,     ____________________________________________   PHYSICAL EXAM:  VITAL SIGNS: ED Triage Vitals  Enc Vitals Group     BP 02/17/20 1037 136/82     Pulse Rate 02/17/20 1037 68     Resp 02/17/20 1037 19     Temp 02/17/20 1037 98.3 F (36.8 C)     Temp Source 02/17/20 1037 Oral     SpO2 02/17/20 1037 99 %     Weight --      Height --       Head Circumference --      Peak Flow --      Pain Score 02/17/20 1030 0     Pain Loc --      Pain Edu? --      Excl. in GC? --     Constitutional: Alert and oriented. Well appearing and in no acute distress. Eyes: Conjunctivae are normal.  Head: Atraumatic. Nose: No congestion/rhinnorhea. Mouth/Throat: Mucous membranes are moist.  Throat appears normal Neck:  supple no lymphadenopathy noted Cardiovascular: Normal rate, regular rhythm. Heart sounds are normal Respiratory: Normal respiratory effort.  No retractions, lungs c t a  GU: deferred Musculoskeletal: FROM all extremities, warm and well perfused Neurologic:  Normal speech and language.  Skin:  Skin is warm, dry and intact.  Wound on the left forearm is healed, fine sandpaperlike rash noted on his face and around his eyes, no drainage noted  psychiatric: Mood and affect are normal. Speech and behavior are normal.  ____________________________________________   LABS (all labs ordered are listed, but only abnormal results are displayed)  Labs Reviewed - No data to display ____________________________________________   ____________________________________________  RADIOLOGY    ____________________________________________   PROCEDURES  Procedure(s) performed: No  Procedures    ____________________________________________   INITIAL IMPRESSION / ASSESSMENT AND PLAN / ED COURSE  Pertinent labs & imaging results that were  available during my care of the patient were reviewed by me and considered in my medical decision making (see chart for details).   Patient is 24 year old male presents with rash to the face.  See HPI.  Physical exam shows patient having an allergic reaction secondary to the antibiotic he was placed on 2 weeks ago.  Patient difficulty breathing no sore throat feel that he has a strep infection.  Also Keflex for coverage strep at that time.  I did have to call pharmacy to determine which  antibiotic he was on.  Pharmacist from [facility] states he was given Keflex 2 weeks ago.  Did explain findings to the patient.  Explained him this is an allergic reaction to Keflex.  Is given Decadron 10 mg IM while here in the ED.  Will be placed on Sterapred Dosepak.  He can also take over-the-counter Benadryl.  States he understands.  Patient discharged stable condition.     Seth Frank was evaluated in Emergency Department on 02/17/2020 for the symptoms described in the history of present illness. He was evaluated in the context of the global COVID-19 pandemic, which necessitated consideration that the patient might be at risk for infection with the SARS-CoV-2 virus that causes COVID-19. Institutional protocols and algorithms that pertain to the evaluation of patients at risk for COVID-19 are in a state of rapid change based on information released by regulatory bodies including the CDC and federal and state organizations. These policies and algorithms were followed during the patient's care in the ED.    As part of my medical decision making, I reviewed the following data within the electronic MEDICAL RECORD NUMBER Nursing notes reviewed and incorporated, Old chart reviewed, Notes from prior ED visits and [place] Controlled Substance Database  ____________________________________________   FINAL CLINICAL IMPRESSION(S) / ED DIAGNOSES  Final diagnoses:  Allergic drug rash      NEW MEDICATIONS STARTED DURING THIS VISIT:  New Prescriptions   PREDNISONE (STERAPRED UNI-PAK 21 TAB) 10 MG (21) TBPK TABLET    Take 6 pills on day one then decrease by 1 pill each day     Note:  This document was prepared using Dragon voice recognition software and may include unintentional dictation errors.    Faythe Ghee, PA-C 02/17/20 1203    Merwyn Katos, MD 02/17/20 1534

## 2020-02-17 NOTE — ED Triage Notes (Signed)
Pt c/o itchy rash on the left arm and face for the past 3 weeks since being exposed to a chemical at work, states over the past 3 days having itchy swelling around the eyes.

## 2020-02-17 NOTE — Discharge Instructions (Addendum)
You are allergic to keflex.  I have noted this on your chart but you also need to let  your pharmacist know that you are allergic to keflex.  Any time you see a doctor and they ask you about allergies you need to tell them you are allergic to keflex.   Use the steroid pack starting tomorrow.  You may also take claritin or benadryl to help with the rash.  REturn to the ER if worsening

## 2020-02-17 NOTE — ED Notes (Addendum)
See triage note. Pt c/o rash and itchiness on his L arm and face. Pt does work with Engineer, agricultural at work. Pt is A&Ox4 and NAD. Ambulatory to room.

## 2020-02-17 NOTE — ED Notes (Signed)
Patient verbalizes understanding of discharge instructions. Opportunity for questioning and answers were provided. Armband removed by staff, pt discharged from ED. Ambulated out to lobby  

## 2020-07-08 IMAGING — CR DG SHOULDER 2+V*R*
1 series · 4 of 4 positions shown · non-contrast
Comparison: None.

CLINICAL DATA: Right shoulder pain.

EXAM:
RIGHT SHOULDER - 2+ VIEW

[Series 1: dg shoulder right · 0.14mm/px · 4 of 4 slices shown]
[im 1/4]
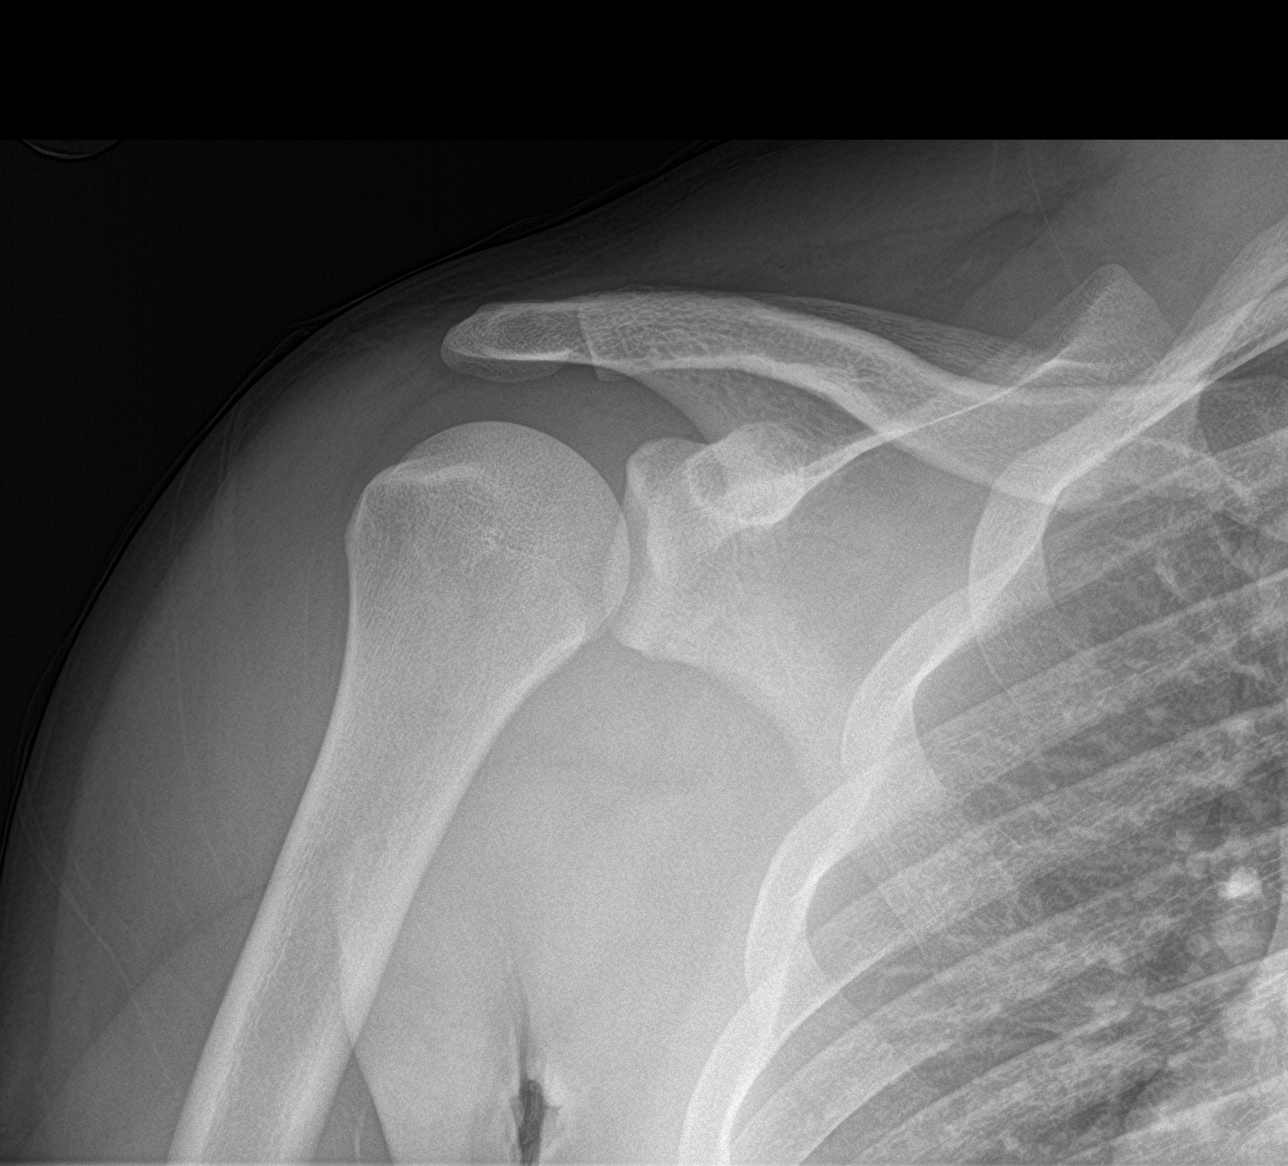
[im 2/4]
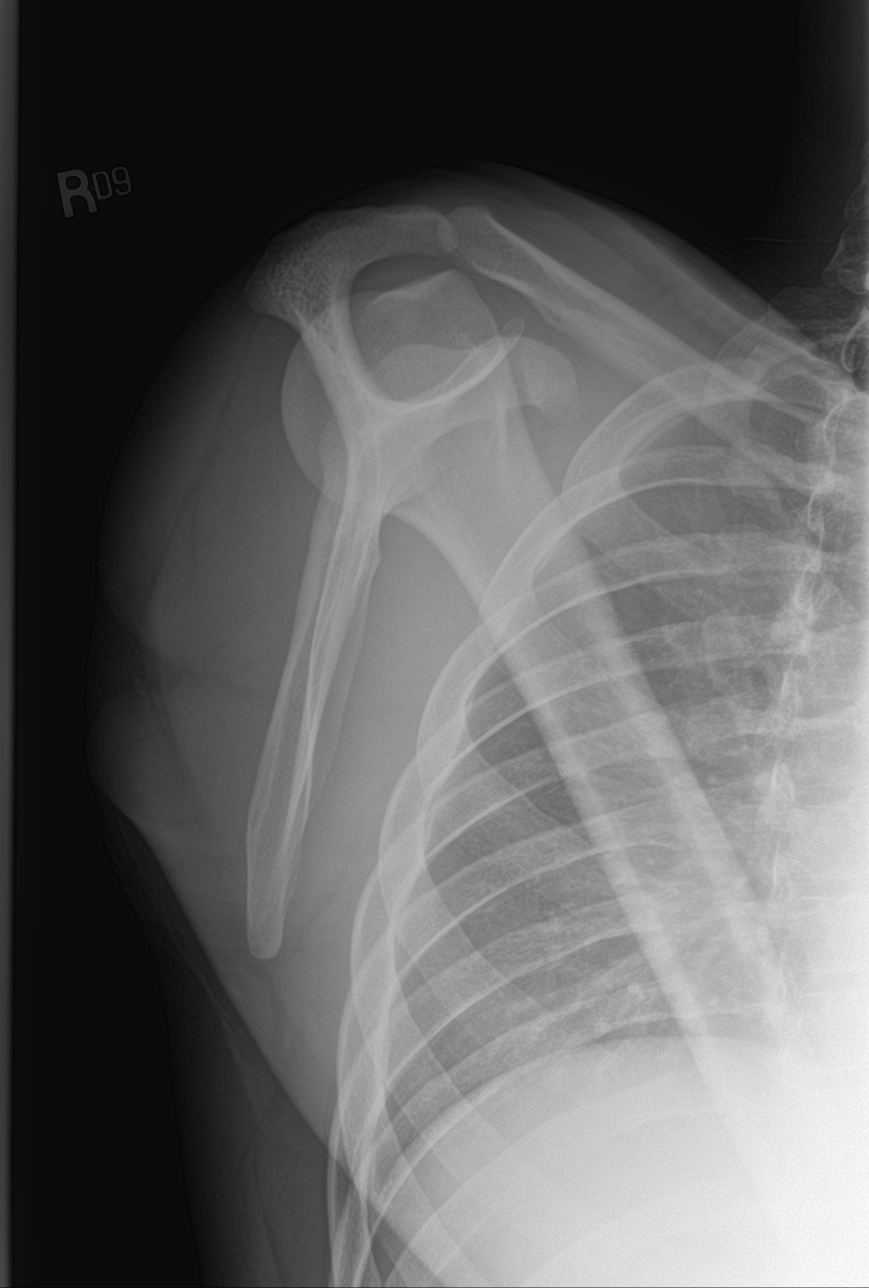
[im 3/4]
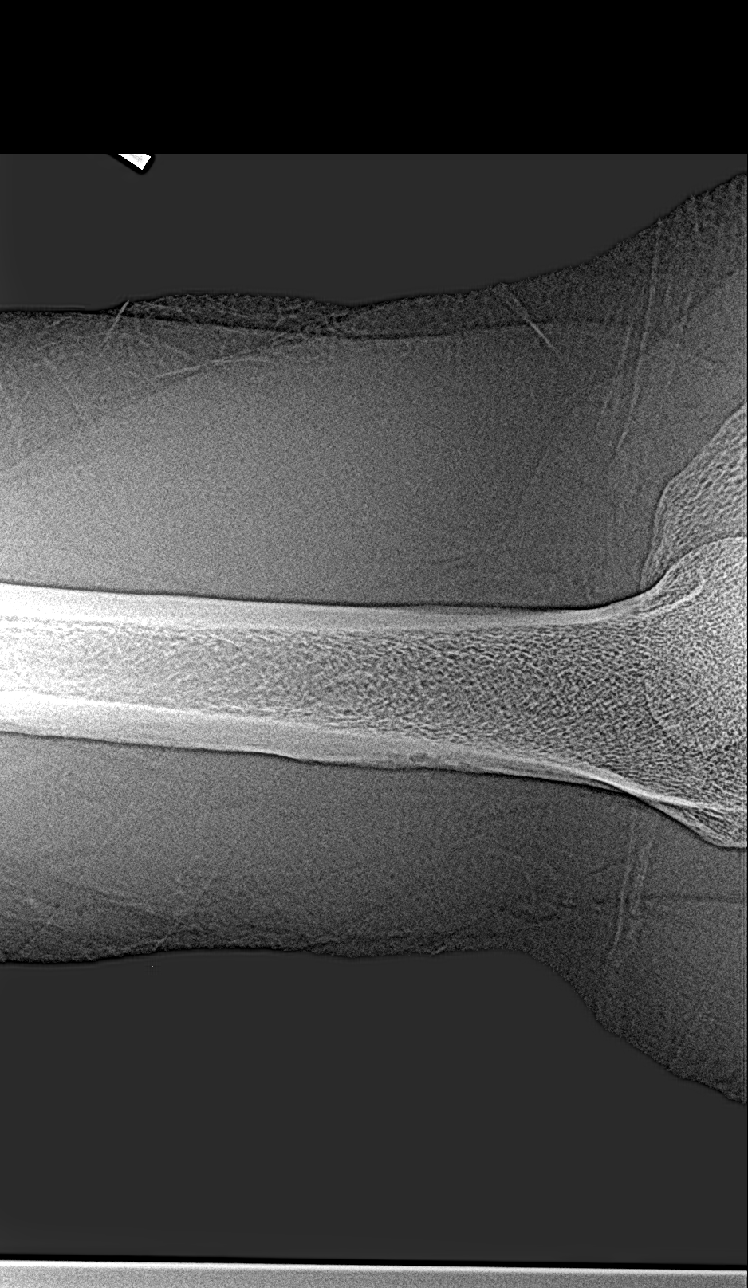
[im 4/4]
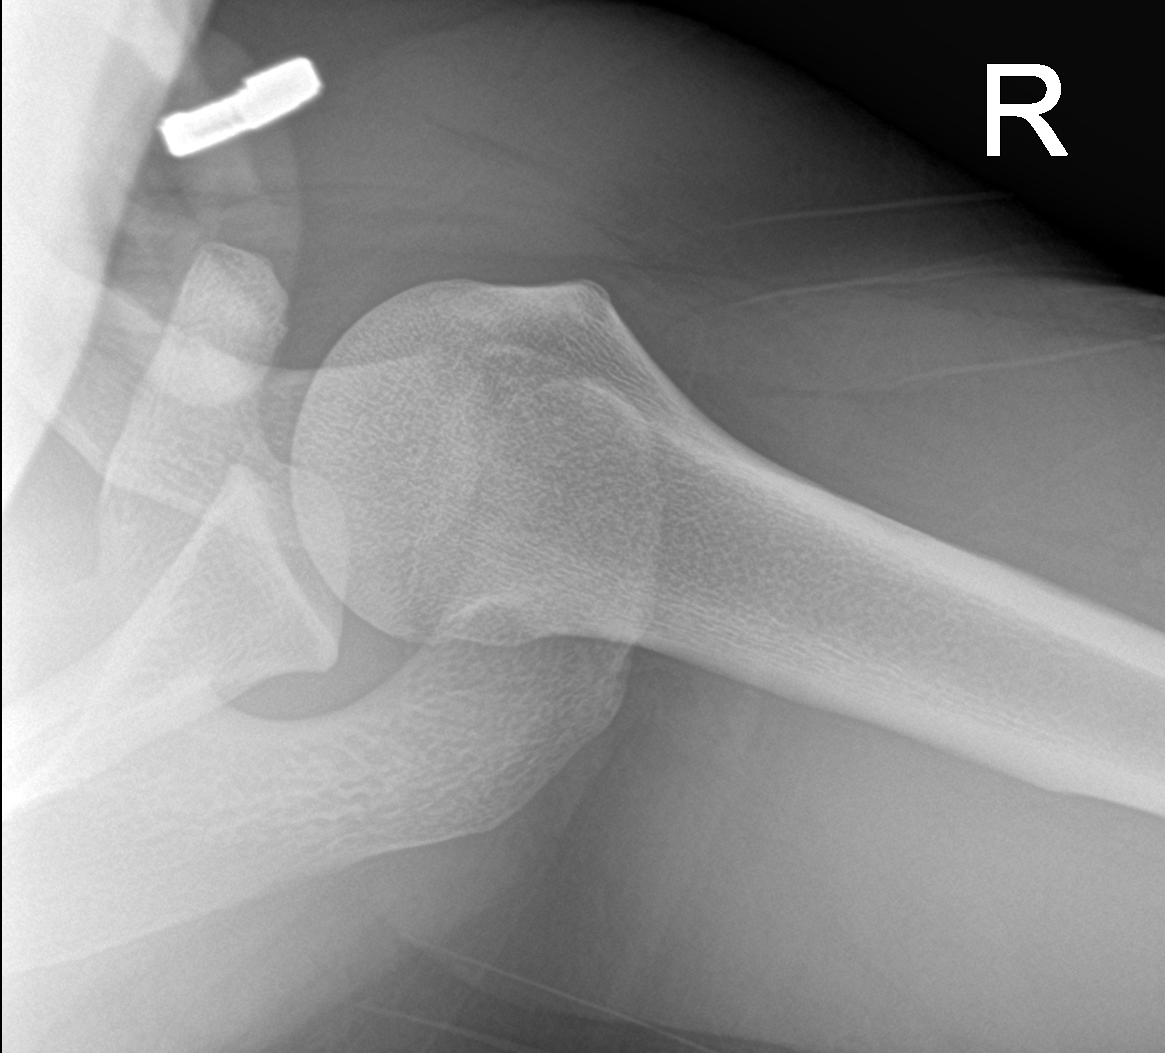

[4 of 4 positions shown; findings below may reference images not displayed]

FINDINGS: There is no evidence of fracture or dislocation. There is no
evidence of arthropathy or other focal bone abnormality. Soft
tissues are unremarkable.
IMPRESSION: Negative.
